# Patient Record
Sex: Male | Born: 1990
Health system: Southern US, Community
[De-identification: ages and names within clinical notes are randomized; demographics above are authoritative.]

## PROBLEM LIST (undated history)

## (undated) HISTORY — PX: APPENDECTOMY: SHX54

---

## 2015-10-26 DIAGNOSIS — H47312 Coloboma of optic disc, left eye: Secondary | ICD-10-CM | POA: Diagnosis not present

## 2015-10-26 DIAGNOSIS — H527 Unspecified disorder of refraction: Secondary | ICD-10-CM | POA: Diagnosis not present

## 2016-02-13 DIAGNOSIS — Z1389 Encounter for screening for other disorder: Secondary | ICD-10-CM | POA: Diagnosis not present

## 2016-02-13 DIAGNOSIS — Z0000 Encounter for general adult medical examination without abnormal findings: Secondary | ICD-10-CM | POA: Diagnosis not present

## 2016-02-13 DIAGNOSIS — E559 Vitamin D deficiency, unspecified: Secondary | ICD-10-CM | POA: Diagnosis not present

## 2016-02-13 DIAGNOSIS — R5383 Other fatigue: Secondary | ICD-10-CM | POA: Diagnosis not present

## 2016-02-13 DIAGNOSIS — Z114 Encounter for screening for human immunodeficiency virus [HIV]: Secondary | ICD-10-CM | POA: Diagnosis not present

## 2016-02-13 DIAGNOSIS — R0602 Shortness of breath: Secondary | ICD-10-CM | POA: Diagnosis not present

## 2016-04-21 MED FILL — TIVICAY 50 MG TABLET: 50 | 5 days supply | Qty: 5 | Fill #0

## 2016-04-21 MED FILL — DESCOVY 200-25 MG TABS: 200-25 | 5 days supply | Qty: 5 | Fill #0

## 2017-04-04 ENCOUNTER — Ambulatory Visit (INDEPENDENT_AMBULATORY_CARE_PROVIDER_SITE_OTHER): Payer: 59 | Admitting: Emergency Medicine

## 2017-04-04 ENCOUNTER — Encounter: Payer: Self-pay | Admitting: Emergency Medicine

## 2017-04-04 VITALS — BP 106/68 | HR 60 | Temp 98.4°F | Resp 16 | Ht 68.0 in | Wt 203.8 lb

## 2017-04-04 DIAGNOSIS — Z111 Encounter for screening for respiratory tuberculosis: Secondary | ICD-10-CM | POA: Diagnosis not present

## 2017-04-04 NOTE — Progress Notes (Signed)
William Berger 26 y.o.   Chief Complaint  Patient presents with  . PPD Reading    TB SKIN TEST for college    HISTORY OF PRESENT ILLNESS: This is a 26 y.o. male here for TB skin test.  HPI   Prior to Admission medications   Not on File    Not on File  There are no active problems to display for this patient.   No past medical history on file.  No past surgical history on file.  Social History   Social History  . Marital status: Single    Spouse name: N/A  . Number of children: N/A  . Years of education: N/A   Occupational History  . Not on file.   Social History Main Topics  . Smoking status: Never Smoker  . Smokeless tobacco: Never Used  . Alcohol use No  . Drug use: No  . Sexual activity: Not on file   Other Topics Concern  . Not on file   Social History Narrative  . No narrative on file    No family history on file.   Review of Systems  Constitutional: Negative.  Negative for chills, fever and weight loss.  Respiratory: Negative for cough, hemoptysis and shortness of breath.   Gastrointestinal: Negative for diarrhea, nausea and vomiting.  Skin: Negative for rash.  Neurological: Negative for dizziness and headaches.  All other systems reviewed and are negative.   Vitals:   04/04/17 1717  BP: 106/68  Pulse: 60  Resp: 16  Temp: 98.4 F (36.9 C)  SpO2: 98%    Physical Exam  Constitutional: He is oriented to person, place, and time. He appears well-developed and well-nourished.  HENT:  Head: Normocephalic.  Eyes: Pupils are equal, round, and reactive to light.  Neck: Normal range of motion.  Cardiovascular: Normal rate.   Pulmonary/Chest: Effort normal.  Musculoskeletal: Normal range of motion.  Neurological: He is alert and oriented to person, place, and time.  Skin: Skin is warm and dry. No rash noted.  Psychiatric: He has a normal mood and affect. His behavior is normal.  Vitals reviewed.    ASSESSMENT &  PLAN:  [name] was seen today for ppd reading.  Diagnoses and all orders for this visit:  Visit for TB skin test -     TB Skin Test   Patient Instructions    Return in 48-72 hours for PPD reading.   IF you received an x-ray today, you will receive an invoice from [facility]. Please contact [facility] at [phone] with questions or concerns regarding your invoice.   IF you received labwork today, you will receive an invoice from [place]. Please contact [facility] at [phone] with questions or concerns regarding your invoice.   Our billing staff will not be able to assist you with questions regarding bills from these companies.  You will be contacted with the lab results as soon as they are available. The fastest way to get your results is to activate your My Chart account. Instructions are located on the last page of this paperwork. If you have not heard from [place] regarding the results in 2 weeks, please contact this office.    Tuberculin Skin Test Why am I having this test? Tuberculosis (TB) is a bacterial infection caused by Mycobacterium tuberculosis. Most people who are exposed to these bacteria have a strong enough defense (immune) system to prevent the bacteria from causing TB and developing symptoms. Their bodies prevent the germs from being  active and making them sick (latent TB infection). However, if you have TB germs in your body and your immune system is weak, you can develop a TB infection. This can cause symptoms such as:  Night sweats.  Fever.  Weakness.  Weight loss.  A latent TB infection can also become active later in life if your immune system becomes weakened or compromised. You may have this test if your health care provider suspects that you have TB. You may also have this test to screen for TB if you are at risk for getting the disease. Those at increased risk include:  People who inject illegal drugs or share needles.  People  with HIV or other diseases that affect immunity.  Health care workers.  People who live in high-risk communities, such as homeless shelters, nursing homes, and correctional facilities.  People who have been in contact with someone with TB.  People from countries where TB is more common.  If you are in a high-risk group, your health care provider may wish to screen for TB more often. This can help prevent the spread of the disease. Sometimes TB screening is required when starting a new job, such as becoming a Scientist, forensichealth care worker or a Runner, broadcasting/film/videoteacher. Colleges or universities may require it of new students. What is being tested? A tuberculin skin test is the main test used to check for exposure to the bacteria that can cause TB. The test checks for antibodies to the bacteria. Antibodies are proteins that your body produces to protect you from germs and other things that can make you sick. Your health care provider will inject a solution known as PPD (purified protein derivative) under the first layer of skin on your arm. This causes a blister-like bubble to form at the site. Your health care provider will then examine the site after a number of hours have passed to see if a reaction has occurred. How do I prepare for this test? There is no preparation required for this test. What do the results mean? Your test results will be reported as either negative or positive. If the tuberculin skin test produces a negative result, it is likely that you do not have TB and have not been exposed to the TB bacteria. If you or your health care provider suspects exposure, however, you may want to repeat the test a few weeks later. A blood test may also be used to check for TB. This is because you will not react to the tuberculin skin test until several weeks after exposure to TB bacteria. If you test positive to the tuberculin skin test, it is likely that you have been exposed to TB bacteria. The test does not distinguish  between an active and a latent TB infection. A false-positive result can occur. A false-positive result for TB bacteria is incorrect because it indicates a condition or finding is present when it is not. Talk to your health care provider to discuss your results, treatment options, and if necessary, the need for more tests. It is your responsibility to obtain your test results. Ask the lab or department performing the test when and how you will get your results. Talk with your health care provider if you have any questions about your results. Talk with your health care provider to discuss your results, treatment options, and if necessary, the need for more tests. Talk with your health care provider if you have any questions about your results. This information is not intended to replace  advice given to you by your health care provider. Make sure you discuss any questions you have with your health care provider. Document Released: 03/08/2005 Document Revised: 01/30/2016 Document Reviewed: 09/22/2013 Elsevier Interactive Patient Education  2018 Elsevier Inc.      Edwina Barth, MD [facility]

## 2017-04-04 NOTE — Patient Instructions (Addendum)
Return in 48-72 hours for PPD reading.   IF you received an x-ray today, you will receive an invoice from [facility]. Please contact [facility] at [phone] with questions or concerns regarding your invoice.   IF you received labwork today, you will receive an invoice from [facility]. Please contact [facility] at [phone] with questions or concerns regarding your invoice.   Our billing staff will not be able to assist you with questions regarding bills from these companies.  You will be contacted with the lab results as soon as they are available. The fastest way to get your results is to activate your My Chart account. Instructions are located on the last page of this paperwork. If you have not heard from us regarding the results in 2 weeks, please contact this office.    Tuberculin Skin Test Why am I having this test? Tuberculosis (TB) is a bacterial infection caused by Mycobacterium tuberculosis. Most people who are exposed to these bacteria have a strong enough defense (immune) system to prevent the bacteria from causing TB and developing symptoms. Their bodies prevent the germs from being active and making them sick (latent TB infection). However, if you have TB germs in your body and your immune system is weak, you can develop a TB infection. This can cause symptoms such as:  Night sweats.  Fever.  Weakness.  Weight loss.  A latent TB infection can also become active later in life if your immune system becomes weakened or compromised. You may have this test if your health care provider suspects that you have TB. You may also have this test to screen for TB if you are at risk for getting the disease. Those at increased risk include:  People who inject illegal drugs or share needles.  People with HIV or other diseases that affect immunity.  Health care workers.  People who live in high-risk communities, such as homeless shelters, nursing homes, and  correctional facilities.  People who have been in contact with someone with TB.  People from countries where TB is more common.  If you are in a high-risk group, your health care provider may wish to screen for TB more often. This can help prevent the spread of the disease. Sometimes TB screening is required when starting a new job, such as becoming a Scientist, forensichealth care worker or a Runner, broadcasting/film/videoteacher. Colleges or universities may require it of new students. What is being tested? A tuberculin skin test is the main test used to check for exposure to the bacteria that can cause TB. The test checks for antibodies to the bacteria. Antibodies are proteins that your body produces to protect you from germs and other things that can make you sick. Your health care provider will inject a solution known as PPD (purified protein derivative) under the first layer of skin on your arm. This causes a blister-like bubble to form at the site. Your health care provider will then examine the site after a number of hours have passed to see if a reaction has occurred. How do I prepare for this test? There is no preparation required for this test. What do the results mean? Your test results will be reported as either negative or positive. If the tuberculin skin test produces a negative result, it is likely that you do not have TB and have not been exposed to the TB bacteria. If you or your health care provider suspects exposure, however, you may want to repeat the test a few weeks later.  A blood test may also be used to check for TB. This is because you will not react to the tuberculin skin test until several weeks after exposure to TB bacteria. If you test positive to the tuberculin skin test, it is likely that you have been exposed to TB bacteria. The test does not distinguish between an active and a latent TB infection. A false-positive result can occur. A false-positive result for TB bacteria is incorrect because it indicates a  condition or finding is present when it is not. Talk to your health care provider to discuss your results, treatment options, and if necessary, the need for more tests. It is your responsibility to obtain your test results. Ask the lab or department performing the test when and how you will get your results. Talk with your health care provider if you have any questions about your results. Talk with your health care provider to discuss your results, treatment options, and if necessary, the need for more tests. Talk with your health care provider if you have any questions about your results. This information is not intended to replace advice given to you by your health care provider. Make sure you discuss any questions you have with your health care provider. Document Released: 03/08/2005 Document Revised: 01/30/2016 Document Reviewed: 09/22/2013 Elsevier Interactive Patient Education  [employer]2018 Elsevier Inc.

## 2017-04-07 ENCOUNTER — Ambulatory Visit: Payer: 59 | Admitting: Family Medicine

## 2017-04-07 LAB — TB SKIN TEST
Induration: 0 mm
TB Skin Test: NEGATIVE

## 2017-04-09 ENCOUNTER — Ambulatory Visit (INDEPENDENT_AMBULATORY_CARE_PROVIDER_SITE_OTHER): Payer: 59 | Admitting: Physician Assistant

## 2017-04-09 ENCOUNTER — Encounter: Payer: Self-pay | Admitting: Physician Assistant

## 2017-04-09 VITALS — BP 134/82 | HR 60 | Temp 98.2°F | Resp 18 | Ht 68.86 in | Wt 200.2 lb

## 2017-04-09 DIAGNOSIS — R8290 Unspecified abnormal findings in urine: Secondary | ICD-10-CM

## 2017-04-09 DIAGNOSIS — R300 Dysuria: Secondary | ICD-10-CM | POA: Diagnosis not present

## 2017-04-09 LAB — POCT URINALYSIS DIP (MANUAL ENTRY)
BILIRUBIN UA: NEGATIVE
BILIRUBIN UA: NEGATIVE mg/dL
GLUCOSE UA: NEGATIVE mg/dL
Leukocytes, UA: NEGATIVE
NITRITE UA: NEGATIVE
Protein Ur, POC: NEGATIVE mg/dL
RBC UA: NEGATIVE
SPEC GRAV UA: 1.02 (ref 1.010–1.025)
Urobilinogen, UA: 0.2 E.U./dL
pH, UA: 6.5 (ref 5.0–8.0)

## 2017-04-09 NOTE — Patient Instructions (Addendum)
Your urinalysis and physical exam findings were reassuring today. I am glad that your symptoms have resolved. We have collected additional labs today and will contact you within the next week with these results. Please return if your symptoms return. Thank you for letting me participate in your health and well being.    IF you received an x-ray today, you will receive an invoice from [facility]. Please contact [facility] at [phone] with questions or concerns regarding your invoice.   IF you received labwork today, you will receive an invoice from [facility]. Please contact [facility] at [phone] with questions or concerns regarding your invoice.   Our billing staff will not be able to assist you with questions regarding bills from these companies.  You will be contacted with the lab results as soon as they are available. The fastest way to get your results is to activate your My Chart account. Instructions are located on the last page of this paperwork. If you have not heard from us regarding the results in 2 weeks, please contact this office.

## 2017-04-09 NOTE — Progress Notes (Signed)
04/09/2017 at 6:17 PM  French Ana / DOB: [birth year] / MRN: [id number]  The patient has Visit for TB skin test on his problem list.  SUBJECTIVE  William Berger is a 26 y.o. male who complains of cloudy urine x 2 days about a week ago, which resolved. He denies rectal pain, penile discharge, penile pain, testicular pain, testicular swelling, dysuria, hematuria, urinary frequency, urinary urgency, flank pain, abdominal pain, genital rash and genital irritation. Has not tried anything for relief. He typically drinks a lot of water and his urine is clear but he did eat a lot of candy the day he noticed the cloudiness. Pt has not been sexually active in about 3 months but has not been tested for STDs in quite some time. Would like to have testing today.   He  has no past medical history on file.    Medications reviewed and updated by myself where necessary, and exist elsewhere in the encounter.   Mr. Kathryne Sharper has No Known Allergies. He  reports that he has never smoked. He has never used smokeless tobacco. He reports that he does not drink alcohol or use drugs. He  has no sexual activity history on file. The patient  has a past surgical history that includes Appendectomy.  His family history is not on file.  Review of Systems  Constitutional: Negative for chills, diaphoresis and fever.  Gastrointestinal: Negative for nausea and vomiting.    OBJECTIVE  His  height is 5' 8.86" (1.749 m) and weight is 200 lb 3.2 oz (90.8 kg). His oral temperature is 98.2 F (36.8 C). His blood pressure is 134/82 and his pulse is 60. His respiration is 18 and oxygen saturation is 98%.  The patient's body mass index is 29.69 kg/m.  Physical Exam  Constitutional: He is oriented to person, place, and time. He appears well-developed and well-nourished.  HENT:  Head: Normocephalic and atraumatic.  Eyes: Conjunctivae are normal.  Neck: Normal range of motion.  Pulmonary/Chest: Effort  normal.  Abdominal: Soft. Bowel sounds are normal. There is no tenderness. There is no CVA tenderness. Hernia confirmed negative in the right inguinal area and confirmed negative in the left inguinal area.  Neurological: He is alert and oriented to person, place, and time.  Skin: Skin is warm and dry.  Psychiatric: He has a normal mood and affect.  Vitals reviewed.   Results for orders placed or performed in visit on 04/09/17 (from the past 24 hour(s))  POCT urinalysis dipstick     Status: None   Collection Time: 04/09/17  5:23 PM  Result Value Ref Range   Color, UA yellow yellow   Clarity, UA clear clear   Glucose, UA negative negative mg/dL   Bilirubin, UA negative negative   Ketones, POC UA negative negative mg/dL   Spec Grav, UA 1.517 6.160 - 1.025   Blood, UA negative negative   pH, UA 6.5 5.0 - 8.0   Protein Ur, POC negative negative mg/dL   Urobilinogen, UA 0.2 0.2 or 1.0 E.U./dL   Nitrite, UA Negative Negative   Leukocytes, UA Negative Negative    ASSESSMENT & PLAN  Odel was seen today for urinary retention and urinary urgency.  Diagnoses and all orders for this visit:  Cloudy urine -     POCT urinalysis dipstick -     GC/Chlamydia Probe Amp -     Urine Microscopic -     Urine Culture -     HIV antibody -  RPR -     Trichomonas vaginalis, RNA   His symptoms have resolved. UA normal. No abnormal findings on exam. Labs pending. The patient was advised to call or come back to clinic if he does not see an improvement in symptoms, or worsens with the above plan.   Benjiman CoreBrittany Ahnyla Mendel, PA-C [facility] 04/09/2017 6:17 PM

## 2017-04-09 NOTE — Progress Notes (Deleted)
   04/09/2017 at 5:48 PM  William Berger / DOB: 1990/08/05 / MRN: [id number]  The patient has Visit for TB skin test on his problem list.  SUBJECTIVE  William Berger is a 26 y.o. male who complains of {UTI Symptoms:210800002} x *** days. He denies {UTI Symptoms:210800002}. Has tried *** with no relief. Most recent UTI prior to this was ***.   He  has no past medical history on file.    Medications reviewed and updated by myself where necessary, and exist elsewhere in the encounter.   [name] has No Known Allergies. He  reports that he has never smoked. He has never used smokeless tobacco. He reports that he does not drink alcohol or use drugs. He  has no sexual activity history on file. The patient  has a past surgical history that includes Appendectomy.  His family history is not on file.  ROS  OBJECTIVE  His  height is 5' 8.86" (1.749 m) and weight is 200 lb 3.2 oz (90.8 kg). His oral temperature is 98.2 F (36.8 C). His blood pressure is 134/82 and his pulse is 60. His respiration is 18 and oxygen saturation is 98%.  The patient's body mass index is 29.69 kg/m.  Physical Exam  Results for orders placed or performed in visit on 04/09/17 (from the past 24 hour(s))  POCT urinalysis dipstick     Status: None   Collection Time: 04/09/17  5:23 PM  Result Value Ref Range   Color, UA yellow yellow   Clarity, UA clear clear   Glucose, UA negative negative mg/dL   Bilirubin, UA negative negative   Ketones, POC UA negative negative mg/dL   Spec Grav, UA 4.0981.020 1.1911.010 - 1.025   Blood, UA negative negative   pH, UA 6.5 5.0 - 8.0   Protein Ur, POC negative negative mg/dL   Urobilinogen, UA 0.2 0.2 or 1.0 E.U./dL   Nitrite, UA Negative Negative   Leukocytes, UA Negative Negative    ASSESSMENT & PLAN  William Berger was seen today for urinary retention and urinary urgency.  Diagnoses and all orders for this visit:  Dysuria -     POCT urinalysis dipstick -      GC/Chlamydia Probe Amp -     Urine Microscopic    The patient was advised to call or come back to clinic if he does not see an improvement in symptoms, or worsens with the above plan.   William CoreBrittany Danel Studzinski, PA-C [facility] 04/09/2017 5:48 PM

## 2017-04-10 LAB — RPR: RPR: NONREACTIVE

## 2017-04-10 LAB — URINALYSIS, MICROSCOPIC ONLY
CASTS: NONE SEEN /LPF
Epithelial Cells (non renal): NONE SEEN /hpf (ref 0–10)

## 2017-04-10 LAB — HIV ANTIBODY (ROUTINE TESTING W REFLEX): HIV Screen 4th Generation wRfx: NONREACTIVE

## 2017-04-10 LAB — GC/CHLAMYDIA PROBE AMP
Chlamydia trachomatis, NAA: NEGATIVE
Neisseria gonorrhoeae by PCR: NEGATIVE

## 2017-04-11 LAB — URINE CULTURE

## 2017-04-11 LAB — TRICHOMONAS VAGINALIS, PROBE AMP: Trich vag by NAA: NEGATIVE

## 2018-01-09 NOTE — Progress Notes (Deleted)
     MRN: [id number] DOB: [birth year]  Subjective:   William Berger is a 27 y.o. male presenting for chief complaint of No chief complaint on file. .  Reports *** history of {URI Symptoms :210800001}, {Systemic Symptoms:4344663276}. Has tried *** relief. Denies ***fever, {URI Symptoms :210800001}, {Systemic Symptoms:4344663276}. Has *** had *** sick contact with ***. *** history of seasonal allergies, history of asthma. Patient *** flu shot this season. *** smoking, *** alcohol. Denies any other aggravating or relieving factors, no other questions or concerns.  William Berger currently has no medications in their medication list. Also has No Known Allergies.  William Berger  has no past medical history on file. Also  has a past surgical history that includes Appendectomy.   Objective:   Vitals: There were no vitals taken for this visit.  Physical Exam  No results found for this or any previous visit (from the past 24 hour(s)).  Assessment and Plan :  There are no diagnoses linked to this encounter.  Benjiman CoreBrittany Sharaya Boruff, PA-C  [facility] 01/09/2018 8:42 PM

## 2018-01-10 ENCOUNTER — Ambulatory Visit: Payer: 59 | Admitting: Physician Assistant

## 2018-03-14 ENCOUNTER — Encounter: Payer: Self-pay | Admitting: Physician Assistant

## 2018-03-14 ENCOUNTER — Ambulatory Visit (INDEPENDENT_AMBULATORY_CARE_PROVIDER_SITE_OTHER): Payer: 59 | Admitting: Physician Assistant

## 2018-03-14 VITALS — BP 118/78 | HR 85 | Temp 98.6°F | Resp 16 | Ht 69.0 in | Wt 213.0 lb

## 2018-03-14 DIAGNOSIS — G43809 Other migraine, not intractable, without status migrainosus: Secondary | ICD-10-CM | POA: Diagnosis not present

## 2018-03-14 MED ORDER — CYCLOBENZAPRINE HCL 10 MG PO TABS: 10.0000 mg | ORAL_TABLET | Freq: Three times a day (TID) | ORAL | 1 refills | Status: DC | PRN

## 2018-03-14 MED ORDER — MELOXICAM 7.5 MG PO TABS: 7.5000 mg | ORAL_TABLET | Freq: Every day | ORAL | 1 refills | Status: DC

## 2018-03-14 MED FILL — MELOXICAM 7.5 MG TABLET: 7.5 | 40 days supply | Qty: 60 | Fill #0

## 2018-03-14 MED FILL — CYCLOBENZAPRINE 10 MG TAB: 10 | 10 days supply | Qty: 30 | Fill #0

## 2018-03-14 NOTE — Patient Instructions (Addendum)
You will receive a phone call to schedule an appointment with physical therapy  Meloxiam is an NSAID. Do not use with any other otc pain medication other than tylenol/acetaminophen - so no aleve, ibuprofen, motrin, advil, etc. You can take 1-2 pills/day   Flexeril is a muscle relaxer. This may make you drowsy. Do not drive or work if this makes you drowsy. Take at night if needed.  You can take these medications together as they do not interact.   Apply moist heat to the area. Wet a towel and wring it out so it is damp. Put it in the microwave for about 15-20 seconds - long enough to make it hot, but not too hot to apply to your skin causing burns. Do this for about 20-30 minutes, 3-4 times a day.   Perform gentle, light stretches 2-3 times a day.   Put a tennis ball between your back and a wall. Gentle massage the area with rolling the ball around the affected area. Try a foam roller.   Stay well hydrated - try to drink 32-64 oz/day.   If you feel like you need a new pillow, try "My Pillow".  Come back and see me in 4 weeks if no improvement.   Thank you for coming in today. I hope you feel we met your needs.  Feel free to call PCP if you have any questions or further requests.  Please consider signing up for MyChart if you do not already have it, as this is a great way to communicate with me.  Best,  [employer], PA-C

## 2018-03-14 NOTE — Progress Notes (Signed)
   William [facility]  MRN: [id number] DOB: January 29, 1991  PCP: Patient, No Pcp Per  Subjective:  Pt is a 27 year old male who presents to clinic for migraines x 1 week.   Wakes up with a "skull crushing headache" water, Pedialyte and Excedrin helps.  HA is 5/10 HA located in the back of his head and stays localized to his head. Does not radiate.  HA is from morning and lasts all day.  Episodes of "being really sweaty" lasts about 10 min.  Stress is not that bad.  Regular exercise.   Denies photophobia or phonophobia, n/v, vision changes, aura, fever, chills, neck pain.  Recently cut back on caffeine "just cause"  Girlfriend says he snores.  No daytime somnolence.   Review of Systems  Eyes: Negative for photophobia and visual disturbance.  Gastrointestinal: Negative for nausea and vomiting.  Musculoskeletal: Negative for neck pain and neck stiffness.  Neurological: Positive for headaches. Negative for dizziness, syncope and light-headedness.    Patient Active Problem List   Diagnosis Date Noted  . Visit for TB skin test 04/04/2017    No current outpatient medications on file prior to visit.   No current facility-administered medications on file prior to visit.     No Known Allergies   Objective:  BP 118/78 (BP Location: Left Arm, Patient Position: Sitting, Cuff Size: Large)   Pulse 85   Temp 98.6 F (37 C) (Oral)   Resp 16   Ht 5\' 9"  (1.753 m)   Wt 213 lb (96.6 kg)   SpO2 98%   BMI 31.45 kg/m   Physical Exam  Constitutional: He is oriented to person, place, and time. He appears well-developed and well-nourished.  Eyes: Pupils are equal, round, and reactive to light. Conjunctivae and EOM are normal.  Cardiovascular: Normal rate and regular rhythm.  Neurological: He is alert and oriented to person, place, and time.  Skin: Skin is warm and dry.  Psychiatric: He has a normal mood and affect. His behavior is normal. Judgment and thought content normal.  Vitals  reviewed.   Assessment and Plan :  1. Other migraine without status migrainosus, not intractable - pt c/o migraines. Suspect MSK in origin due to PHI. Will try mobic and flexeril and physical therapy. RTC in 4 weeks if no improvement and will try different approach.  - meloxicam (MOBIC) 7.5 MG tablet; Take 1-1.5 tablets (7.5-11.25 mg total) by mouth daily.  Dispense: 60 tablet; Refill: 1 - cyclobenzaprine (FLEXERIL) 10 MG tablet; Take 1 tablet (10 mg total) by mouth 3 (three) times daily as needed for muscle spasms.  Dispense: 30 tablet; Refill: 1 - Ambulatory referral to Physical Therapy  Marco Collie, PA-C  [facility] at [facility] 03/14/2018 10:36 AM  Please note: Portions of this report may have been transcribed using dragon voice recognition software. Every effort was made to ensure accuracy; however, inadvertent computerized transcription errors may be present.

## 2020-05-15 ENCOUNTER — Ambulatory Visit (HOSPITAL_COMMUNITY)
Admission: EM | Admit: 2020-05-15 | Discharge: 2020-05-15 | Disposition: A | Discharge status: Home / Self Care | Payer: 59 | Attending: Emergency Medicine | Admitting: Emergency Medicine

## 2020-05-15 ENCOUNTER — Other Ambulatory Visit: Payer: Self-pay

## 2020-05-15 ENCOUNTER — Encounter (HOSPITAL_COMMUNITY): Payer: Self-pay

## 2020-05-15 DIAGNOSIS — R0981 Nasal congestion: Secondary | ICD-10-CM

## 2020-05-15 DIAGNOSIS — Z20822 Contact with and (suspected) exposure to covid-19: Secondary | ICD-10-CM | POA: Insufficient documentation

## 2020-05-15 DIAGNOSIS — J101 Influenza due to other identified influenza virus with other respiratory manifestations: Secondary | ICD-10-CM | POA: Diagnosis not present

## 2020-05-15 DIAGNOSIS — J0100 Acute maxillary sinusitis, unspecified: Secondary | ICD-10-CM | POA: Insufficient documentation

## 2020-05-15 DIAGNOSIS — J209 Acute bronchitis, unspecified: Secondary | ICD-10-CM | POA: Insufficient documentation

## 2020-05-15 DIAGNOSIS — J029 Acute pharyngitis, unspecified: Secondary | ICD-10-CM | POA: Insufficient documentation

## 2020-05-15 DIAGNOSIS — R059 Cough, unspecified: Secondary | ICD-10-CM

## 2020-05-15 LAB — RESP PANEL BY RT-PCR (FLU A&B, COVID) ARPGX2
Influenza A by PCR: POSITIVE — AB
Influenza B by PCR: NEGATIVE
SARS Coronavirus 2 by RT PCR: NEGATIVE

## 2020-05-15 LAB — POCT RAPID STREP A, ED / UC: Streptococcus, Group A Screen (Direct): NEGATIVE

## 2020-05-15 MED ORDER — BENZONATATE 100 MG PO CAPS: 100.0000 mg | ORAL_CAPSULE | Freq: Three times a day (TID) | ORAL | 0 refills | Status: DC

## 2020-05-15 MED ORDER — AMOXICILLIN 875 MG PO TABS: 875.0000 mg | ORAL_TABLET | Freq: Two times a day (BID) | ORAL | 0 refills | Status: AC

## 2020-05-15 NOTE — Discharge Instructions (Signed)
Take the amoxicillin and Tessalon Perles as directed.    Your rapid strep test is negative.  A throat culture is pending; we will call you if it is positive requiring treatment.    Your COVID and Flu tests are pending.  You should self quarantine until the test results are back.    Take Tylenol or ibuprofen as needed for fever or discomfort.  Rest and keep yourself hydrated.    Follow-up with your primary care provider if your symptoms are not improving.

## 2020-05-15 NOTE — ED Triage Notes (Signed)
Pt presents with sore throat, nasal congestion, chills and  cough x 1 week. Denies fever, sob.  Ibuprofen and OTC cough and cold gives some relief.

## 2020-05-15 NOTE — ED Provider Notes (Signed)
[facility]    CSN: [id number] Arrival date & time: 05/15/20  1405      History   Chief Complaint Chief Complaint  Patient presents with  . Sore Throat  . Cough  . Nasal Congestion    HPI William Berger is a 29 y.o. male.   Patient presents with 1 week history of chills, congestion, sore throat, nonproductive cough.  He denies fever, rash, shortness of breath, vomiting, diarrhea, or other symptoms.  Treatment attempted at home with ibuprofen and OTC cold medication.  He denies pertinent medical history.  The history is provided by the patient.    History reviewed. No pertinent past medical history.  Patient Active Problem List   Diagnosis Date Noted  . Visit for TB skin test 04/04/2017    Past Surgical History:  Procedure Laterality Date  . APPENDECTOMY         Home Medications    Prior to Admission medications   Medication Sig Start Date End Date Taking? Authorizing Provider  ibuprofen (ADVIL) 200 MG tablet Take 200 mg by mouth every 6 (six) hours as needed.   Yes [provider]  Multiple Vitamin (MULTIVITAMIN) tablet Take 1 tablet by mouth daily.   Yes [provider]  amoxicillin (AMOXIL) 875 MG tablet Take 1 tablet (875 mg total) by mouth 2 (two) times daily for 7 days. 05/15/20 05/22/20  Mickie Bail, NP  benzonatate (TESSALON) 100 MG capsule Take 1 capsule (100 mg total) by mouth every 8 (eight) hours. 05/15/20   Mickie Bail, NP  cyclobenzaprine (FLEXERIL) 10 MG tablet Take 1 tablet (10 mg total) by mouth 3 (three) times daily as needed for muscle spasms. 03/14/18   McVey, Madelaine Bhat, PA-C  meloxicam (MOBIC) 7.5 MG tablet Take 1-1.5 tablets (7.5-11.25 mg total) by mouth daily. 03/14/18   McVey, Madelaine Bhat, PA-C    Family History History reviewed. No pertinent family history.  Social History Social History   Tobacco Use  . Smoking status: Never Smoker  . Smokeless tobacco: Never Used  Vaping Use  .  Vaping Use: Never used  Substance Use Topics  . Alcohol use: No  . Drug use: No     Allergies   Patient has no known allergies.   Review of Systems Review of Systems  Constitutional: Positive for chills. Negative for fever.  HENT: Positive for congestion and sore throat. Negative for ear pain.   Eyes: Negative for pain and visual disturbance.  Respiratory: Positive for cough. Negative for shortness of breath.   Cardiovascular: Negative for chest pain and palpitations.  Gastrointestinal: Negative for abdominal pain, diarrhea and vomiting.  Genitourinary: Negative for dysuria and hematuria.  Musculoskeletal: Negative for arthralgias and back pain.  Skin: Negative for color change and rash.  Neurological: Negative for seizures and syncope.  All other systems reviewed and are negative.    Physical Exam Triage Vital Signs ED Triage Vitals  Enc Vitals Group     BP 05/15/20 1501 136/85     Pulse Rate 05/15/20 1501 72     Resp 05/15/20 1501 19     Temp 05/15/20 1501 98.4 F (36.9 C)     Temp Source 05/15/20 1501 Oral     SpO2 05/15/20 1501 100 %     Weight --      Height --      Head Circumference --      Peak Flow --      Pain Score 05/15/20 1459 4  Pain Loc --      Pain Edu? --      Excl. in GC? --    No data found.  Updated Vital Signs BP 136/85 (BP Location: Right Arm)   Pulse 72   Temp 98.4 F (36.9 C) (Oral)   Resp 19   SpO2 100%   Visual Acuity Right Eye Distance:   Left Eye Distance:   Bilateral Distance:    Right Eye Near:   Left Eye Near:    Bilateral Near:     Physical Exam Vitals and nursing note reviewed.  Constitutional:      General: He is not in acute distress.    Appearance: He is well-developed.  HENT:     Head: Normocephalic and atraumatic.     Right Ear: Tympanic membrane normal.     Left Ear: Tympanic membrane normal.     Nose: Nose normal.     Mouth/Throat:     Mouth: Mucous membranes are moist.     Pharynx: Posterior  oropharyngeal erythema present. No oropharyngeal exudate.  Eyes:     Conjunctiva/sclera: Conjunctivae normal.  Cardiovascular:     Rate and Rhythm: Normal rate and regular rhythm.     Heart sounds: Normal heart sounds.  Pulmonary:     Effort: Pulmonary effort is normal. No respiratory distress.     Breath sounds: Normal breath sounds. No wheezing or rhonchi.  Abdominal:     Palpations: Abdomen is soft.     Tenderness: There is no abdominal tenderness. There is no guarding or rebound.  Musculoskeletal:     Cervical back: Neck supple.  Skin:    General: Skin is warm and dry.     Findings: No rash.  Neurological:     General: No focal deficit present.     Mental Status: He is alert and oriented to person, place, and time.     Gait: Gait normal.  Psychiatric:        Mood and Affect: Mood normal.        Behavior: Behavior normal.      UC Treatments / Results  Labs (all labs ordered are listed, but only abnormal results are displayed) Labs Reviewed  RESP PANEL BY RT-PCR (FLU A&B, COVID) ARPGX2  CULTURE, GROUP A STREP [facility])  POCT RAPID STREP A, ED / UC    EKG   Radiology No results found.  Procedures Procedures (including critical care time)  Medications Ordered in UC Medications - No data to display  Initial Impression / Assessment and Plan / UC Course  I have reviewed the triage vital signs and the nursing notes.  Pertinent labs & imaging results that were available during my care of the patient were reviewed by me and considered in my medical decision making (see chart for details).   Acute sinusitis, acute bronchitis, sore throat. Treating with amoxicillin and Tessalon Perles. Rapid strep negative; culture pending.  Influenza and COVID pending.  Instructed patient to self quarantine until the test results are back.  Discussed symptomatic treatment including Tylenol, rest, hydration.  Instructed patient to follow up with PCP if his symptoms are not improving   Patient agrees to plan of care.    Final Clinical Impressions(s) / UC Diagnoses   Final diagnoses:  Acute non-recurrent maxillary sinusitis  Acute bronchitis, unspecified organism  Sore throat     Discharge Instructions     Take the amoxicillin and Tessalon Perles as directed.    Your rapid strep test is negative.  A throat culture is pending; we will call you if it is positive requiring treatment.    Your COVID and Flu tests are pending.  You should self quarantine until the test results are back.    Take Tylenol or ibuprofen as needed for fever or discomfort.  Rest and keep yourself hydrated.    Follow-up with your primary care provider if your symptoms are not improving.        ED Prescriptions    Medication Sig Dispense Auth. Provider   amoxicillin (AMOXIL) 875 MG tablet Take 1 tablet (875 mg total) by mouth 2 (two) times daily for 7 days. 14 tablet Mickie Bail, NP   benzonatate (TESSALON) 100 MG capsule Take 1 capsule (100 mg total) by mouth every 8 (eight) hours. 21 capsule Mickie Bail, NP     PDMP not reviewed this encounter.   Mickie Bail, NP 05/15/20 [phone]

## 2020-05-18 LAB — CULTURE, GROUP A STREP (THRC)

## 2020-08-24 ENCOUNTER — Ambulatory Visit: Payer: Self-pay

## 2020-11-07 ENCOUNTER — Other Ambulatory Visit: Payer: Self-pay

## 2020-11-07 ENCOUNTER — Ambulatory Visit (HOSPITAL_COMMUNITY)
Admission: RE | Admit: 2020-11-07 | Discharge: 2020-11-07 | Disposition: A | Discharge status: Home / Self Care | Payer: 59 | Source: Ambulatory Visit

## 2020-11-07 NOTE — ED Notes (Signed)
Pt had an appointment for 1300, showed up late.

## 2020-11-23 ENCOUNTER — Other Ambulatory Visit: Payer: Self-pay

## 2020-11-23 ENCOUNTER — Emergency Department (INDEPENDENT_AMBULATORY_CARE_PROVIDER_SITE_OTHER): Payer: 59

## 2020-11-23 ENCOUNTER — Ambulatory Visit: Payer: Self-pay

## 2020-11-23 ENCOUNTER — Emergency Department
Admission: EM | Admit: 2020-11-23 | Discharge: 2020-11-23 | Disposition: A | Discharge status: Home / Self Care | Payer: 59 | Source: Home / Self Care

## 2020-11-23 ENCOUNTER — Emergency Department
Admission: RE | Admit: 2020-11-23 | Discharge: 2020-11-23 | Discharge status: Absent without leave | Payer: Self-pay | Source: Ambulatory Visit

## 2020-11-23 DIAGNOSIS — S39012A Strain of muscle, fascia and tendon of lower back, initial encounter: Secondary | ICD-10-CM

## 2020-11-23 DIAGNOSIS — M545 Low back pain, unspecified: Secondary | ICD-10-CM

## 2020-11-23 DIAGNOSIS — G43809 Other migraine, not intractable, without status migrainosus: Secondary | ICD-10-CM

## 2020-11-23 MED ORDER — CYCLOBENZAPRINE HCL 10 MG PO TABS: 10.0000 mg | ORAL_TABLET | Freq: Three times a day (TID) | ORAL | 0 refills | Status: AC | PRN

## 2020-11-23 NOTE — ED Provider Notes (Signed)
[facility]    CSN: [id number] Arrival date & time: 11/23/20  1337      History   Chief Complaint Chief Complaint  Patient presents with   Back Pain    HPI William Berger is a 30 y.o. male who presents with R back pain from glut to lumbar region intermittently x 1 month. 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 He woke up the next day with pain as noted.  Pain is described as tightness and is provoked when gets up from being in bed and bending over. Improves as he moves around during the day.  Able to tolerate his work out and does not make the pain any worse. Has been taking advil which helped. Pain level is 3/10 and resolves after taking it.  Denies radiation to legs or paresthesia.  Has strained a muscle in the same area several years ago. On occasion flairs up.     History reviewed. No pertinent past medical history.  Patient Active Problem List   Diagnosis Date Noted   Visit for TB skin test 04/04/2017    Past Surgical History:  Procedure Laterality Date   APPENDECTOMY         Home Medications    Prior to Admission medications   Medication Sig Start Date End Date Taking? Authorizing Provider  benzonatate (TESSALON) 100 MG capsule Take 1 capsule (100 mg total) by mouth every 8 (eight) hours. 05/15/20   Mickie Bail, NP  cyclobenzaprine (FLEXERIL) 10 MG tablet Take 1 tablet (10 mg total) by mouth 3 (three) times daily as needed for muscle spasms. 11/23/20   Rodriguez-Southworth, Nettie Elm, PA-C  ibuprofen (ADVIL) 200 MG tablet Take 200 mg by mouth every 6 (six) hours as needed.    [provider]  meloxicam (MOBIC) 7.5 MG tablet Take 1-1.5 tablets (7.5-11.25 mg total) by mouth daily. 03/14/18   McVey, Madelaine Bhat, PA-C  Multiple Vitamin (MULTIVITAMIN) tablet Take 1 tablet by mouth daily.    [provider]    Family History Family  History  Problem Relation Age of Onset   Healthy Mother    Healthy Father     Social History Social History   Tobacco Use   Smoking status: Never   Smokeless tobacco: Never  Vaping Use   Vaping Use: Never used  Substance Use Topics   Alcohol use: Yes    Alcohol/week: 2.0 standard drinks    Types: 2 Standard drinks or equivalent per week    Comment: week   Drug use: No     Allergies   Patient has no known allergies.   Review of Systems Review of Systems  Genitourinary:  Negative for difficulty urinating.  Musculoskeletal:  Positive for back pain. Negative for gait problem.  Neurological:  Negative for weakness and numbness.    Physical Exam Triage Vital Signs ED Triage Vitals  Enc Vitals Group     BP 11/23/20 1354 121/85     Pulse Rate 11/23/20 1354 89     Resp 11/23/20 1354 18     Temp 11/23/20 1354 98.4 F (36.9 C)     Temp Source 11/23/20 1354 Oral     SpO2 11/23/20 1354 97 %     Weight 11/23/20 1349 215 lb (97.5 kg)     Height 11/23/20 1349 5\' 10"  (1.778 m)     Head Circumference --  Peak Flow --      Pain Score 11/23/20 1349 2     Pain Loc --      Pain Edu? --      Excl. in GC? --    No data found.  Updated Vital Signs BP 121/85   Pulse 89   Temp 98.4 F (36.9 C) (Oral)   Resp 18   Ht 5\' 10"  (1.778 m)   Wt 215 lb (97.5 kg)   SpO2 97%   BMI 30.85 kg/m   Visual Acuity Right Eye Distance:   Left Eye Distance:   Bilateral Distance:    Right Eye Near:   Left Eye Near:    Bilateral Near:     Physical Exam Vitals and nursing note reviewed.  Constitutional:      General: He is not in acute distress.    Appearance: He is normal weight. He is not toxic-appearing.  HENT:     Right Ear: External ear normal.     Left Ear: External ear normal.  Eyes:     General: No scleral icterus.    Conjunctiva/sclera: Conjunctivae normal.  Musculoskeletal:        General: Normal range of motion.     Cervical back: Neck supple.     Comments:  BACK- no kyphosis or scoliosis noted. Has mild tenderness on R mid lumbar and glut muscle region. Pain provoked on his R lumbar region with L lateral flexion and R rotation. Has neg SLR.   Skin:    General: Skin is warm and dry.     Findings: No bruising, erythema or rash.  Neurological:     Mental Status: He is alert and oriented to person, place, and time.     Motor: No weakness.     Gait: Gait normal.     Deep Tendon Reflexes: Reflexes normal.  Psychiatric:        Mood and Affect: Mood normal.        Behavior: Behavior normal.        Thought Content: Thought content normal.        Judgment: Judgment normal.     UC Treatments / Results  Labs (all labs ordered are listed, but only abnormal results are displayed) Labs Reviewed - No data to display  EKG   Radiology DG Lumbar Spine Complete  Result Date: 11/23/2020 CLINICAL DATA:  Right low back pain for 1 month. EXAM: LUMBAR SPINE - COMPLETE 4+ VIEW COMPARISON:  None. FINDINGS: There are 5 non rib-bearing lumbar type vertebrae. Vertebral alignment is normal. There is mild anterior wedging of the T11-L1 vertebral bodies. Mild degenerative endplate spurring is present at L1-2. Intervertebral disc space heights are preserved. No significant facet arthropathy is evident. IMPRESSION: Mild anterior wedge compression deformities of the T11-L1 vertebral bodies of indeterminate age. Electronically Signed   By: 11/25/2020 M.D.   On: 11/23/2020 14:49    Procedures Procedures (including critical care time)  Medications Ordered in UC Medications - No data to display  Initial Impression / Assessment and Plan / UC Course  I have reviewed the triage vital signs and the nursing notes. Pertinent  imaging results that were available during my care of the patient were reviewed by me and considered in my medical decision making (see chart for details). Has muscular gluteal and lumbar pain with normal neuro exam.  I Advised him to see chiropractor  and physical therapy to help him heal and I placed him on Flexeril as noted.  Final Clinical Impressions(s) / UC Diagnoses   Final diagnoses:  Strain of lumbar region, initial encounter     Discharge Instructions      Try Chiropractic care and physical therapy to help you fully heal     ED Prescriptions     Medication Sig Dispense Auth. Provider   cyclobenzaprine (FLEXERIL) 10 MG tablet Take 1 tablet (10 mg total) by mouth 3 (three) times daily as needed for muscle spasms. 30 tablet Rodriguez-Southworth, Nettie Elm, PA-C      PDMP not reviewed this encounter.   Garey Ham, PA-C 11/23/20 1547

## 2020-11-23 NOTE — Discharge Instructions (Addendum)
Try Chiropractic care and physical therapy to help you fully heal

## 2020-11-23 NOTE — ED Triage Notes (Signed)
Pt presents to Urgent Care with c/o R lower back pain x approx 1 month. Denies problems w/ urination. Does not recall injury.

## 2021-02-02 ENCOUNTER — Other Ambulatory Visit: Payer: Self-pay

## 2021-02-02 ENCOUNTER — Emergency Department
Admission: EM | Admit: 2021-02-02 | Discharge: 2021-02-02 | Disposition: A | Discharge status: Home / Self Care | Payer: 59 | Source: Home / Self Care

## 2021-02-02 DIAGNOSIS — J069 Acute upper respiratory infection, unspecified: Secondary | ICD-10-CM | POA: Diagnosis not present

## 2021-02-02 MED ORDER — AZITHROMYCIN 250 MG PO TABS: ORAL_TABLET | ORAL | 0 refills | Status: AC

## 2021-02-02 NOTE — ED Triage Notes (Signed)
Pt st since Saturday he has had cough congestion and fever and chills. St he had bronchitis a few month back and this feels the same. Pt taking otc flu medications

## 2021-02-02 NOTE — Discharge Instructions (Addendum)
   Rest and push fluids Continue mucinex for the symptoms Fill and take the antibiotic if you fail to improve by 8-10 days of illness Check my chart for results

## 2021-02-02 NOTE — ED Provider Notes (Signed)
[facility]    CSN: [id number] Arrival date & time: 02/02/21  1700      History   Chief Complaint Chief Complaint  Patient presents with   Cough    HPI William Berger is a 30 y.o. male.   HPI  Patient feels like he has "bronchitis again".  He states that similar to his symptoms of December 2021.  I reviewed the medical chart at that time and he was indeed diagnosed as bronchitis and given amoxicillin.  He also had testing done which was positive for influenza.  He never received a phone call regarding his influenza or medication.  In any event he felt better after couple days of starting the medication.  He does not smoke.  No underlying lung disease.  Works as a Buyer, retail.  He is COVID vaccinated.  He gets yearly flu shots.  He has had COVID in the past.  He does not feel like he has COVID at this time but has not done COVID testing.  He has chest congestion, sputum production, scratchy throat, sweats and chills, some fatigue.  Symptoms been present for 3 to 4 days  History reviewed. No pertinent past medical history.  Patient Active Problem List   Diagnosis Date Noted   Visit for TB skin test 04/04/2017    Past Surgical History:  Procedure Laterality Date   APPENDECTOMY         Home Medications    Prior to Admission medications   Medication Sig Start Date End Date Taking? Authorizing Provider  azithromycin (ZITHROMAX Z-PAK) 250 MG tablet Take two pills today followed by one a day until gone 02/02/21  Yes Eustace Moore, MD  cyclobenzaprine (FLEXERIL) 10 MG tablet Take 1 tablet (10 mg total) by mouth 3 (three) times daily as needed for muscle spasms. 11/23/20   Rodriguez-Southworth, Nettie Elm, PA-C  ibuprofen (ADVIL) 200 MG tablet Take 200 mg by mouth every 6 (six) hours as needed.    [provider]    Family History Family History  Problem Relation Age of Onset   Healthy Mother    Healthy Father     Social  History Social History   Tobacco Use   Smoking status: Never   Smokeless tobacco: Never  Vaping Use   Vaping Use: Never used  Substance Use Topics   Alcohol use: Yes    Alcohol/week: 2.0 standard drinks    Types: 2 Standard drinks or equivalent per week    Comment: week   Drug use: No     Allergies   Patient has no known allergies.   Review of Systems Review of Systems See HPI  Physical Exam Triage Vital Signs ED Triage Vitals  Enc Vitals Group     BP 02/02/21 1712 130/84     Pulse Rate 02/02/21 1712 79     Resp 02/02/21 1712 17     Temp 02/02/21 1712 99.1 F (37.3 C)     Temp Source 02/02/21 1712 Oral     SpO2 02/02/21 1712 98 %     Weight 02/02/21 1708 210 lb (95.3 kg)     Height 02/02/21 1708 5\' 10"  (1.778 m)     Head Circumference --      Peak Flow --      Pain Score 02/02/21 1708 0     Pain Loc --      Pain Edu? --      Excl. in GC? --    No data  found.  Updated Vital Signs BP 130/84 (BP Location: Right Arm)   Pulse 79   Temp 99.1 F (37.3 C) (Oral)   Resp 17   Ht 5\' 10"  (1.778 m)   Wt 95.3 kg   SpO2 98%   BMI 30.13 kg/m      Physical Exam Constitutional:      General: He is not in acute distress.    Appearance: He is well-developed.     Comments: Muscular and stocky in appearance, not overweight  HENT:     Head: Normocephalic and atraumatic.     Mouth/Throat:     Comments: Mask is in place.  HEENT exam is normal Eyes:     Conjunctiva/sclera: Conjunctivae normal.     Pupils: Pupils are equal, round, and reactive to light.  Cardiovascular:     Rate and Rhythm: Normal rate and regular rhythm.     Heart sounds: Normal heart sounds.  Pulmonary:     Effort: Pulmonary effort is normal. No respiratory distress.     Breath sounds: Rhonchi present.     Comments: Few anterior rhonchi.  No wheeze or rale Abdominal:     General: There is no distension.     Palpations: Abdomen is soft.  Musculoskeletal:        General: Normal range of motion.      Cervical back: Normal range of motion.  Skin:    General: Skin is warm and dry.  Neurological:     Mental Status: He is alert.  Psychiatric:        Mood and Affect: Mood normal.        Behavior: Behavior normal.     UC Treatments / Results  Labs (all labs ordered are listed, but only abnormal results are displayed) Labs Reviewed  COVID-19, FLU A+B NAA    EKG   Radiology No results found.  Procedures Procedures (including critical care time)  Medications Ordered in UC Medications - No data to display  Initial Impression / Assessment and Plan / UC Course  I have reviewed the triage vital signs and the nursing notes.  Pertinent labs & imaging results that were available during my care of the patient were reviewed by me and considered in my medical decision making (see chart for details).     Reviewed the research that demonstrates most bronchitis is caused by a virus.  He likely has a respiratory virus versus COVID, possibly influenza again although less likely.  Recommend rest fluids and continued Mucinex.  Fill and take antibiotic only if COVID and flu testing is negative and if symptoms persist longer than 8 to 10 days Final Clinical Impressions(s) / UC Diagnoses   Final diagnoses:  Viral URI with cough     Discharge Instructions        Rest and push fluids Continue mucinex for the symptoms Fill and take the antibiotic if you fail to improve by 8-10 days of illness Check my chart for results   ED Prescriptions     Medication Sig Dispense Auth. Provider   azithromycin (ZITHROMAX Z-PAK) 250 MG tablet Take two pills today followed by one a day until gone 6 tablet Delton See, MD      PDMP not reviewed this encounter.   Letta Pate, MD 02/02/21 [phone]

## 2021-02-04 ENCOUNTER — Telehealth: Payer: Self-pay

## 2021-02-04 LAB — COVID-19, FLU A+B NAA
Influenza A, NAA: NOT DETECTED
Influenza B, NAA: NOT DETECTED
SARS-CoV-2, NAA: NOT DETECTED

## 2021-02-04 NOTE — Telephone Encounter (Signed)
Pt left VMM inquiring about recent lab results. Phone call returned and HIPAA compliant VM left informing pt to return nurse call if any questions arise.

## 2021-09-01 IMAGING — DX DG LUMBAR SPINE COMPLETE 4+V
6 series · 6 of 6 positions shown · non-contrast
Comparison: None.

CLINICAL DATA: Right low back pain for 1 month.

EXAM:
LUMBAR SPINE - COMPLETE 4+ VIEW

[l-spine ap]
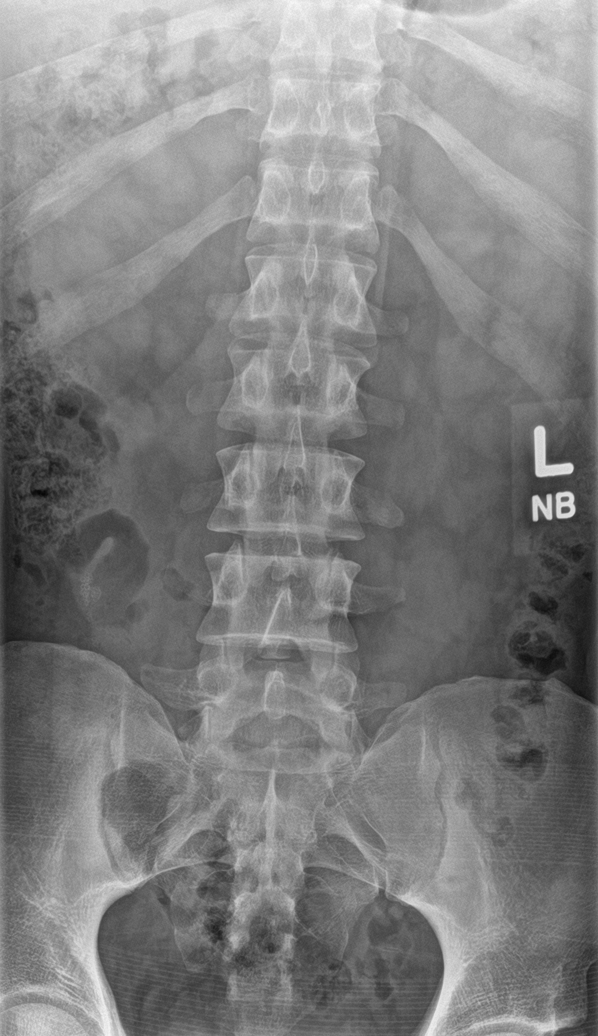

[l-spine obl (1 of 2)]
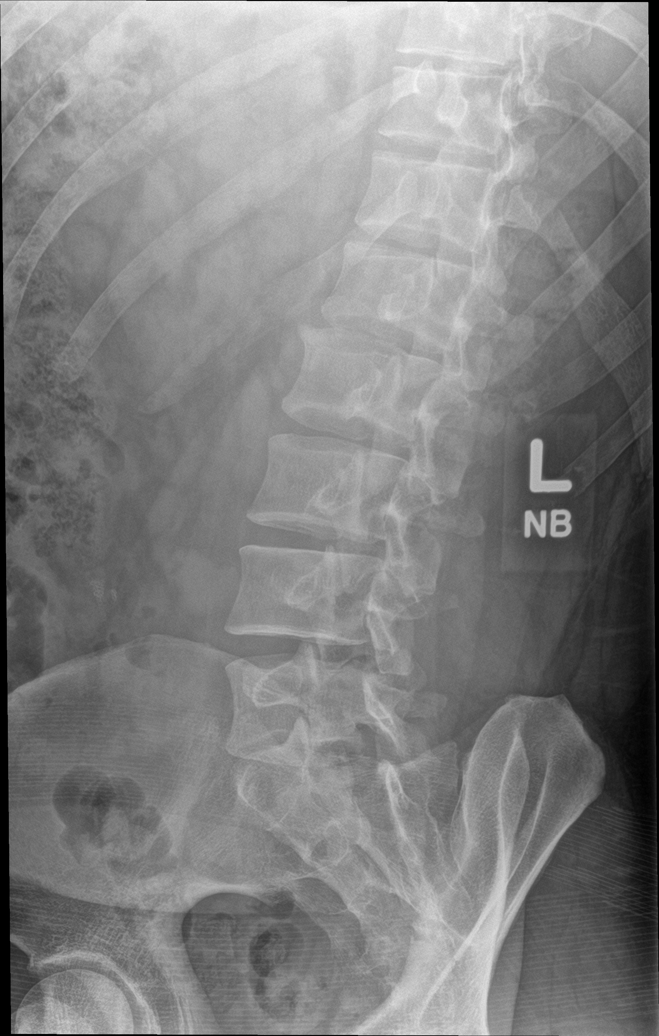

[l-spine obl (2 of 2)]
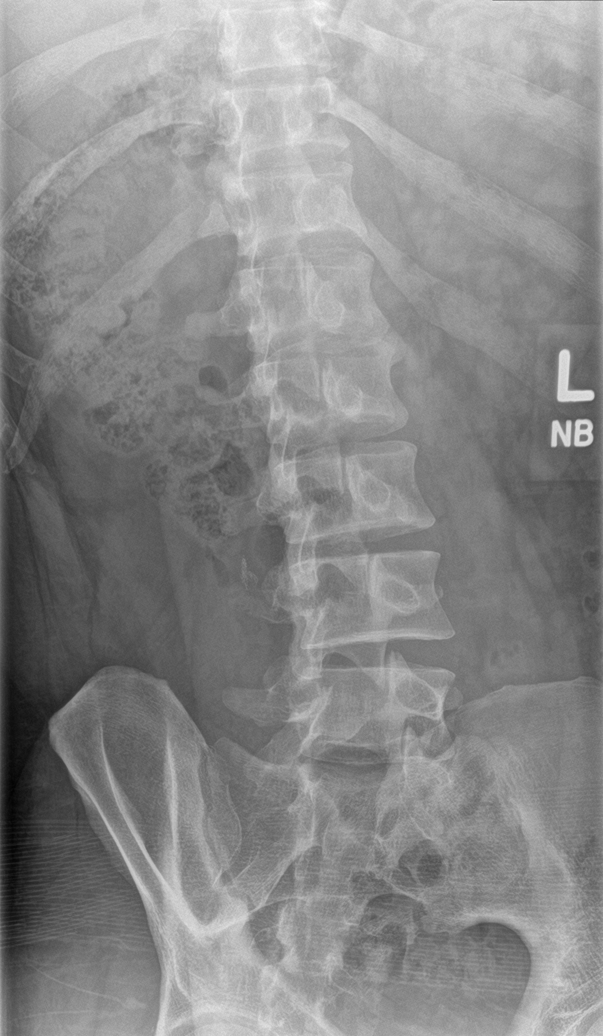

[l-spine lat]
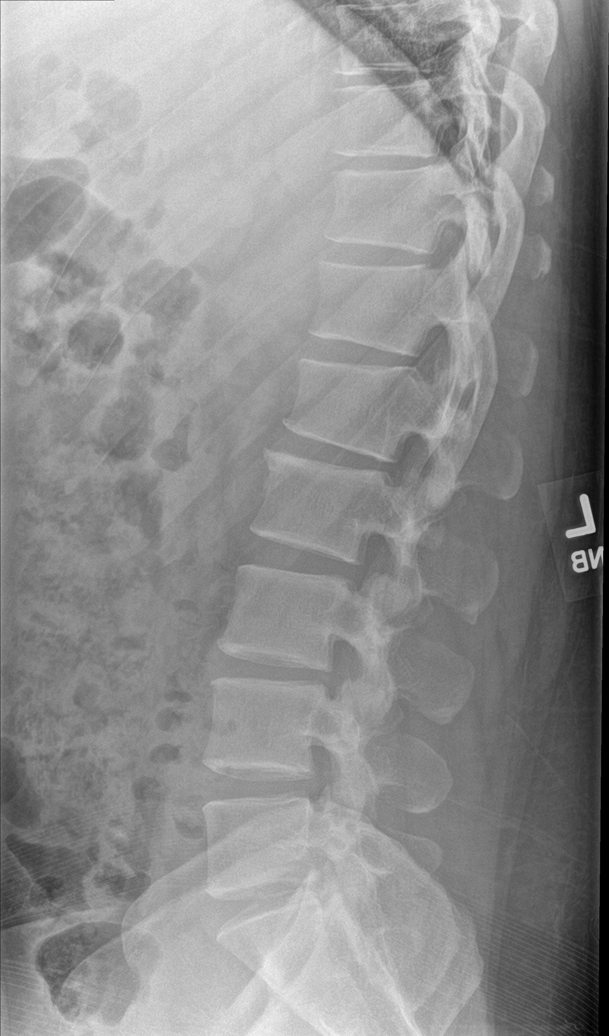

[l-spine spot (1 of 2)]
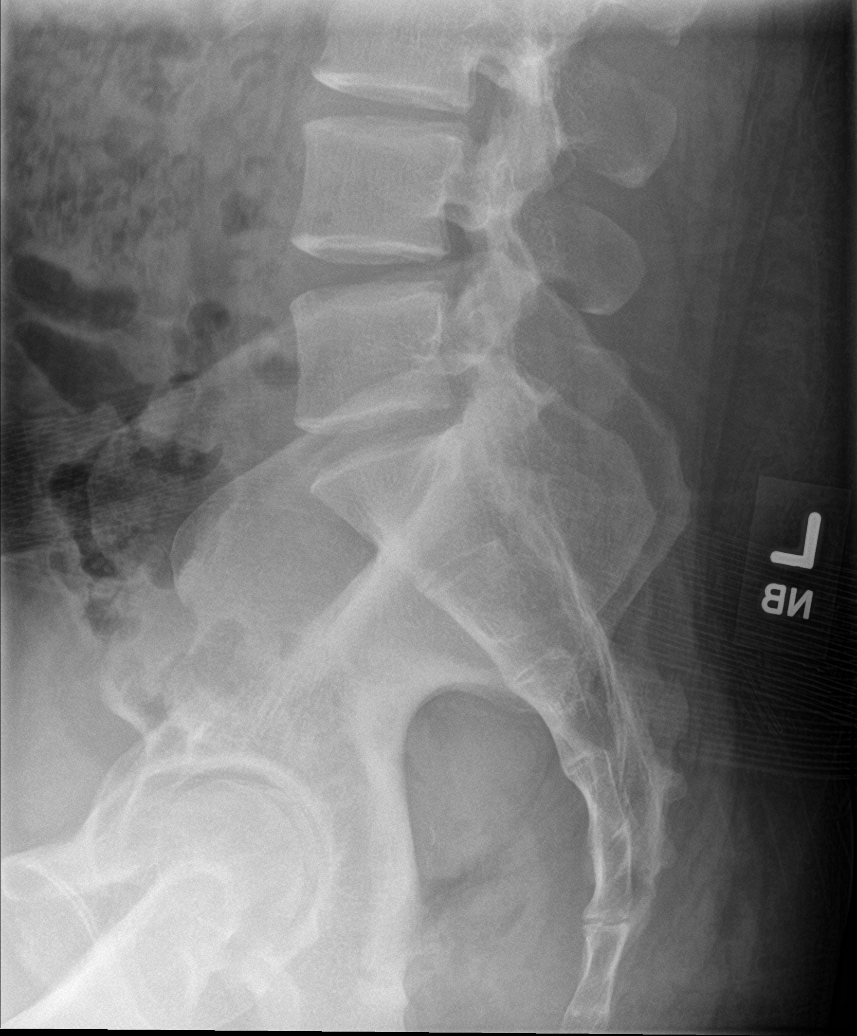

[l-spine spot (2 of 2)]
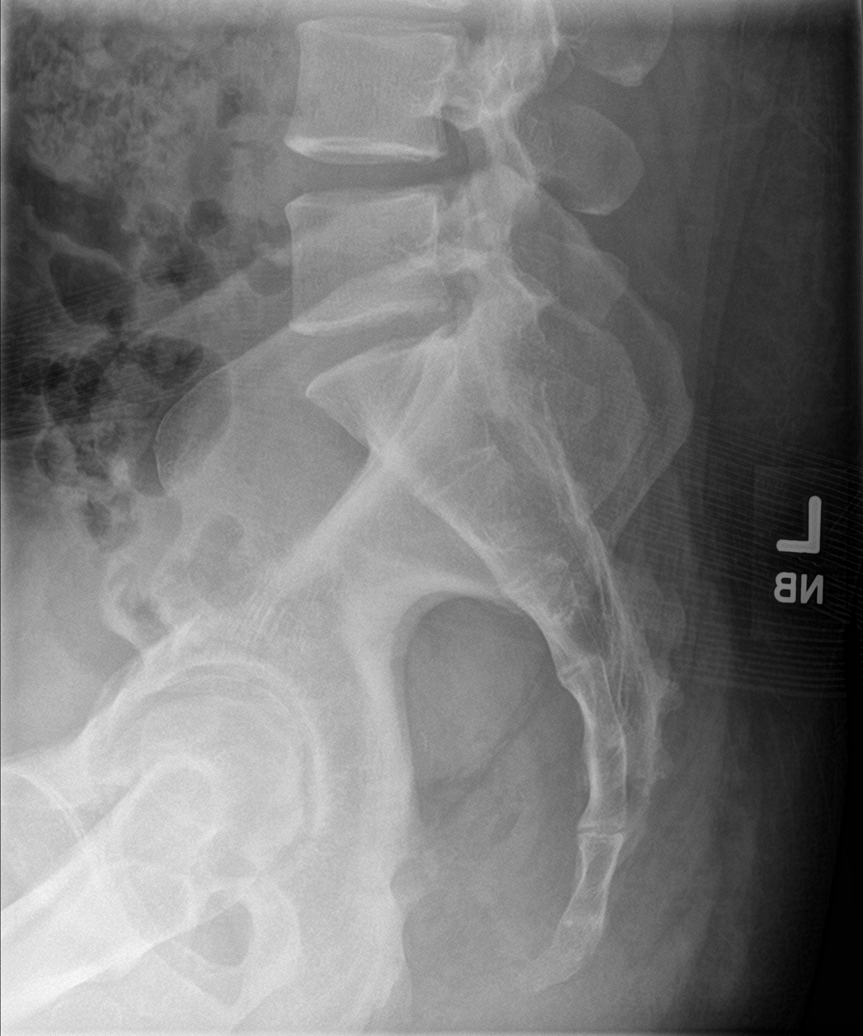

[6 of 6 positions shown; findings below may reference images not displayed]

FINDINGS: There are 5 non rib-bearing lumbar type vertebrae. Vertebral
alignment is normal. There is mild anterior wedging of the T11-L1
vertebral bodies. Mild degenerative endplate spurring is present at
L1-2. Intervertebral disc space heights are preserved. No
significant facet arthropathy is evident.
IMPRESSION: Mild anterior wedge compression deformities of the T11-L1 vertebral
bodies of indeterminate age.
